# Patient Record
Sex: Male | Born: 1945 | Race: White | Hispanic: No | Marital: Married | Smoking: Former smoker
Health system: Southern US, Community
[De-identification: ages and names within clinical notes are randomized; demographics above are authoritative.]

## PROBLEM LIST (undated history)

## (undated) DIAGNOSIS — K2270 Barrett's esophagus without dysplasia: Secondary | ICD-10-CM

## (undated) DIAGNOSIS — I441 Atrioventricular block, second degree: Secondary | ICD-10-CM

## (undated) DIAGNOSIS — I499 Cardiac arrhythmia, unspecified: Secondary | ICD-10-CM

## (undated) DIAGNOSIS — C8590 Non-Hodgkin lymphoma, unspecified, unspecified site: Secondary | ICD-10-CM

## (undated) HISTORY — PX: ESOPHAGOGASTRODUODENOSCOPY: SHX1529

## (undated) HISTORY — DX: Non-Hodgkin lymphoma, unspecified, unspecified site: C85.90

## (undated) HISTORY — DX: Barrett's esophagus without dysplasia: K22.70

## (undated) HISTORY — PX: OTHER SURGICAL HISTORY: SHX169

---

## 1983-09-07 DIAGNOSIS — C8590 Non-Hodgkin lymphoma, unspecified, unspecified site: Secondary | ICD-10-CM

## 1983-09-07 HISTORY — DX: Non-Hodgkin lymphoma, unspecified, unspecified site: C85.90

## 1996-09-06 HISTORY — PX: HEMORRHOID BANDING: SHX5850

## 2003-09-07 HISTORY — PX: CHOLECYSTECTOMY: SHX55

## 2007-09-07 HISTORY — PX: COLON SURGERY: SHX602

## 2009-07-30 ENCOUNTER — Ambulatory Visit: Payer: Self-pay | Admitting: Unknown Physician Specialty

## 2010-08-19 ENCOUNTER — Ambulatory Visit: Payer: Self-pay | Admitting: Internal Medicine

## 2010-08-25 ENCOUNTER — Ambulatory Visit: Payer: Self-pay | Admitting: Internal Medicine

## 2010-09-06 ENCOUNTER — Ambulatory Visit: Payer: Self-pay | Admitting: Internal Medicine

## 2011-09-09 ENCOUNTER — Ambulatory Visit: Payer: Self-pay | Admitting: Unknown Physician Specialty

## 2011-09-10 LAB — PATHOLOGY REPORT

## 2012-03-08 IMAGING — CT NM PET TUM IMG RESTAG (PS) SKULL BASE T - THIGH
1 of 5 series · 2 of 25 positions shown · non-contrast
Comparison: none

REASON FOR EXAM: restaging lymphoma hx of new right SCL lymph node
COMMENTS:

[Series 3: ct wb 3.0 b30f · axial · 3.0mm · 0.98mm/px · z∈[-634,-536]mm · 2 of 435 slices shown]
[im 386/435  brain]
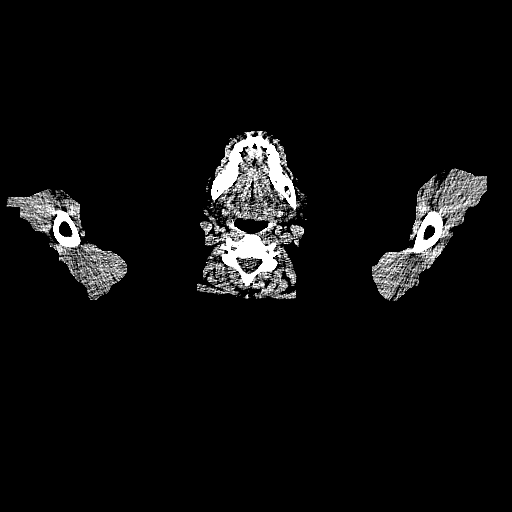
[im 435/435  brain]
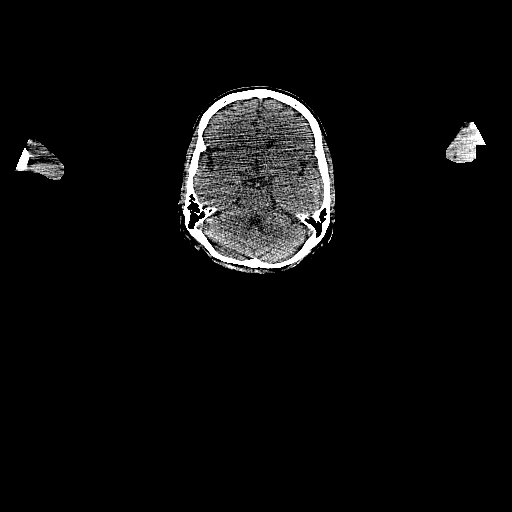

[2 of 25 positions shown; findings below may reference images not displayed]

PROCEDURE:     PET - PET/CT RESTAGING LYMPHOMA  - August 25, 2010  [DATE]

RESULT:     The patient's fasting blood glucose level is measured at 106
mg/dL. The patient received an injection of 12.34 mCi of fluorine 18 labeled
fluorodeoxyglucose in the left hand at [DATE] a.m. Imaging is performed from
the base of the brain to the thighs between the hours of [DATE] and [DATE] a.m.
Noncontrast CT is performed over the same regions for the purposes of
attenuation correction and fusion. The noncontrast CT data set, attenuation
corrected PET data set and fused PET/CT data sets are reconstructed in the
axial, coronal and sagittal planes utilizing the [employer] software. A
rotating three-dimensional MIP of the attenuation corrected PET data is also
utilized. There is no previous study available for comparison.

There is no evidence of abnormal localization to suggest F-18 FDG avid
malignancy. No adenopathy is evident on the CT. Minimal atherosclerotic
calcification is present in the aorta. The urinary bladder is nondistended.
The other abdominal and pulmonary structures appear to be unremarkable.
There does appear to be some atelectasis in the lower lobes.
IMPRESSION: No evidence of F-18 FDG avid malignancy. Normal tracer distribution present.
No definite adenopathy demonstrated.

## 2014-09-06 HISTORY — PX: COLONOSCOPY: SHX174

## 2014-09-12 ENCOUNTER — Ambulatory Visit: Payer: Self-pay | Admitting: Unknown Physician Specialty

## 2014-12-30 LAB — SURGICAL PATHOLOGY

## 2016-01-20 ENCOUNTER — Encounter: Payer: Self-pay | Admitting: *Deleted

## 2016-01-29 ENCOUNTER — Ambulatory Visit (INDEPENDENT_AMBULATORY_CARE_PROVIDER_SITE_OTHER): Payer: Medicare Other | Admitting: General Surgery

## 2016-01-29 ENCOUNTER — Encounter: Payer: Self-pay | Admitting: General Surgery

## 2016-01-29 VITALS — BP 134/70 | HR 50 | Resp 14 | Ht 68.0 in | Wt 157.0 lb

## 2016-01-29 DIAGNOSIS — K648 Other hemorrhoids: Secondary | ICD-10-CM

## 2016-01-29 DIAGNOSIS — K644 Residual hemorrhoidal skin tags: Secondary | ICD-10-CM

## 2016-01-29 NOTE — Patient Instructions (Signed)

## 2016-01-29 NOTE — Progress Notes (Signed)
Patient ID: Arthur Bauer, male   DOB: 1945-09-14, 70 y.o.   MRN: [id number]  Chief Complaint  Patient presents with  . Rectal Problems    hemorrhoid    HPI Arthur Bauer is a 71 y.o. male.  Here today for evaluation of hemorrhoids. He states 3 weeks ago he had rectal swelling, protursion and and pain. He was able to use hydrocortisone cream and supp to help reduce the swelling. The last time his hemorrhoid flared up was about 2 years ago. I have reviewed the history of present illness with the patient.    HPI  Past Medical History  Diagnosis Date  . 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 Barrett esophagus     Past Surgical History  Procedure Laterality Date  . Hemorrhoid banding  1998  . Cholecystectomy  2005  . Colon surgery  2009    Benign  . Colonoscopy  2016    Dr. Vira Agar    Family History  Problem Relation Age of Onset  . Cancer Father     pancreatic  . Alzheimer's disease Mother     Social History Social History  Substance Use Topics  . Smoking status: Former Smoker    Quit date: 09/07/1983  . Smokeless tobacco: Never Used  . Alcohol Use: No    Allergies  Allergen Reactions  . Sulfa Antibiotics Other (See Comments)    As a child    Current Outpatient Prescriptions  Medication Sig Dispense Refill  . atorvastatin (LIPITOR) 10 MG tablet TAKE 1 TABLET BY MOUTH EVERY DAY **NEED APPT FOR MORE REFILLS**  0  . Black Currant Seed Oil 500 MG CAPS Take by mouth.    . hydrocortisone (ANUSOL-HC) 25 MG suppository REMOVE FOIL AND INSERT 1 SUPPSITORY PER RECTUM FOR HEMORROIDS 3 TIMES A DAY  5  . LUTEIN PO Take by mouth.    . magnesium 30 MG tablet Take 30 mg by mouth daily.    . Multiple Vitamin (MULTIVITAMIN) capsule Take 1 capsule by mouth daily.    . NON FORMULARY argenine and beta sitosterol    . Omega-3 Fatty Acids (FISH OIL) 1200 MG CAPS Take by mouth.    Marland Kitchen omeprazole (PRILOSEC) 20 MG capsule TAKE ONE CAPSULE BY MOUTH EVERY DAY AS DIRECTED  1   No current  facility-administered medications for this visit.    Review of Systems Review of Systems  Constitutional: Negative.   Respiratory: Negative.   Cardiovascular: Negative.   Gastrointestinal: Positive for rectal pain. Negative for anal bleeding.    Blood pressure 134/70, pulse 50, resp. rate 14, height 5\' 8"  (1.727 m), weight 157 lb (71.215 kg).  Physical Exam Physical Exam  Constitutional: He is oriented to person, place, and time. He appears well-developed and well-nourished.  HENT:  Mouth/Throat: Oropharynx is clear and moist.  Eyes: Conjunctivae are normal. No scleral icterus.  Neck: Neck supple.  Cardiovascular: Normal rate, regular rhythm and normal heart sounds.   Pulmonary/Chest: Effort normal.  Abdominal: Soft.  Genitourinary: Rectal exam shows external hemorrhoid.  3 o'clock 1.5 cm external hemorrhoid, not inflamed or tender at present  Lymphadenopathy:    He has no cervical adenopathy.  Neurological: He is alert and oriented to person, place, and time.  Skin: Skin is warm and dry.  Psychiatric: His behavior is normal.    Data Reviewed Progress notes.  Assessment    External hemorrhoid. This is not a frequent problem and excision not indicated at present.      Plan  Pt advised fully.   Sample analpram cream given to use prn. Call for an appointment if area flares up again      PCP Dr Lovie Macadamia Ref Charlestine Massed PA   This information has been scribed by Karie Fetch RN, BSN,BC.   SANKAR,SEEPLAPUTHUR G 02/01/2016, 10:46 AM

## 2016-02-01 ENCOUNTER — Encounter: Payer: Self-pay | Admitting: General Surgery

## 2019-05-17 ENCOUNTER — Other Ambulatory Visit: Payer: Self-pay

## 2019-05-17 ENCOUNTER — Other Ambulatory Visit
Admission: RE | Admit: 2019-05-17 | Discharge: 2019-05-17 | Disposition: A | Discharge status: Home / Self Care | Payer: Medicare Other | Source: Ambulatory Visit | Attending: Internal Medicine | Admitting: Internal Medicine

## 2019-05-17 DIAGNOSIS — Z20828 Contact with and (suspected) exposure to other viral communicable diseases: Secondary | ICD-10-CM | POA: Diagnosis present

## 2019-05-17 DIAGNOSIS — Z01812 Encounter for preprocedural laboratory examination: Secondary | ICD-10-CM | POA: Diagnosis not present

## 2019-05-17 LAB — SARS CORONAVIRUS 2 (TAT 6-24 HRS): SARS Coronavirus 2: NEGATIVE

## 2019-05-21 ENCOUNTER — Encounter: Payer: Self-pay | Admitting: *Deleted

## 2019-05-22 ENCOUNTER — Ambulatory Visit: Payer: Medicare Other | Admitting: Certified Registered"

## 2019-05-22 ENCOUNTER — Ambulatory Visit
Admission: RE | Admit: 2019-05-22 | Discharge: 2019-05-22 | Disposition: A | Discharge status: Home / Self Care | Payer: Medicare Other | Attending: Internal Medicine | Admitting: Internal Medicine

## 2019-05-22 ENCOUNTER — Encounter
Admission: RE | Disposition: A | Discharge status: Home / Self Care | Payer: Self-pay | Source: Home / Self Care | Attending: Internal Medicine

## 2019-05-22 ENCOUNTER — Other Ambulatory Visit: Payer: Self-pay

## 2019-05-22 DIAGNOSIS — Z79899 Other long term (current) drug therapy: Secondary | ICD-10-CM | POA: Diagnosis not present

## 2019-05-22 DIAGNOSIS — K2270 Barrett's esophagus without dysplasia: Secondary | ICD-10-CM | POA: Insufficient documentation

## 2019-05-22 DIAGNOSIS — Z8572 Personal history of non-Hodgkin lymphomas: Secondary | ICD-10-CM | POA: Diagnosis not present

## 2019-05-22 DIAGNOSIS — Z87891 Personal history of nicotine dependence: Secondary | ICD-10-CM | POA: Insufficient documentation

## 2019-05-22 DIAGNOSIS — Z13810 Encounter for screening for upper gastrointestinal disorder: Secondary | ICD-10-CM | POA: Diagnosis present

## 2019-05-22 HISTORY — PX: ESOPHAGOGASTRODUODENOSCOPY (EGD) WITH PROPOFOL: SHX5813

## 2019-05-22 SURGERY — ESOPHAGOGASTRODUODENOSCOPY (EGD) WITH PROPOFOL
Anesthesia: General

## 2019-05-22 MED ORDER — PROPOFOL 10 MG/ML IV BOLUS
INTRAVENOUS | Status: DC | PRN
  Administered 2019-05-22: 60 mg via INTRAVENOUS

## 2019-05-22 MED ORDER — PROPOFOL 500 MG/50ML IV EMUL
INTRAVENOUS | Status: AC
  Filled 2019-05-22: qty 50

## 2019-05-22 MED ORDER — ATROPINE SULFATE 0.4 MG/ML IJ SOLN
INTRAMUSCULAR | Status: DC | PRN
  Administered 2019-05-22: 0.4 mg via INTRAVENOUS

## 2019-05-22 MED ORDER — SODIUM CHLORIDE 0.9 % IV SOLN
INTRAVENOUS | Status: DC
  Administered 2019-05-22: 12:00:00 1000 mL via INTRAVENOUS

## 2019-05-22 MED ORDER — PROPOFOL 500 MG/50ML IV EMUL
INTRAVENOUS | Status: DC | PRN
  Administered 2019-05-22: 145 ug/kg/min via INTRAVENOUS

## 2019-05-22 NOTE — Op Note (Signed)
[facility] Patient Name: Arthur Bauer Procedure Date: 05/22/2019 11:05 AM MRN: [id number] Account #: [id number] Date of Birth: [birth year] Admit Type: Outpatient Age: 73 Room: [facility] ENDO ROOM 2 Gender: Male Note Status: Finalized Procedure:            Upper GI endoscopy Indications:          Surveillance for malignancy due to personal history of                        Barrett's esophagus Providers:            Benay Pike. Alice Reichert MD, MD Referring MD:         Youlanda Roys. Lovie Macadamia, MD (Referring MD) Medicines:            Propofol per Anesthesia Complications:        No immediate complications. Procedure:            Pre-Anesthesia Assessment:                       - The risks and benefits of the procedure and the                        sedation options and risks were discussed with the                        patient. All questions were answered and informed                        consent was obtained.                       - Patient identification and proposed procedure were                        verified prior to the procedure by the nurse. The                        procedure was verified in the procedure room.                       - ASA Grade Assessment: III - A patient with severe                        systemic disease.                       - After reviewing the risks and benefits, the patient                        was deemed in satisfactory condition to undergo the                        procedure.                       After obtaining informed consent, the endoscope was                        passed under direct vision. Throughout the procedure,  the patient's blood pressure, pulse, and oxygen                        saturations were monitored continuously. The Endoscope                        was introduced through the mouth, and advanced to the                        third part of duodenum. The upper GI endoscopy was                   accomplished without difficulty. The patient tolerated                        the procedure well. Findings:      There were esophageal mucosal changes secondary to established       short-segment Barrett's disease present at the gastroesophageal       junction. The maximum longitudinal extent of these mucosal changes was 1       cm in length. Mucosa was biopsied with a cold forceps for histology in 4       quadrants at the gastroesophageal junction. One specimen bottle was sent       to pathology.      The stomach was normal.      The examined duodenum was normal. Impression:           - Esophageal mucosal changes secondary to established                        short-segment Barrett's disease. Biopsied.                       - Normal stomach.                       - Normal examined duodenum. Recommendation:       - Patient has a contact number available for                        emergencies. The signs and symptoms of potential                        delayed complications were discussed with the patient.                        Return to normal activities tomorrow. Written discharge                        instructions were provided to the patient.                       - Resume previous diet.                       - Continue present medications.                       - Await pathology results.                       - Refer to a cardiologist at appointment to be  scheduled.                       - Hemodynamically stable cardiac arrhythmia noted                        during today's procedure (bradycardia with possible                        heart block).                       - Return to nurse practitioner as previously scheduled. Procedure Code(s):    --- Professional ---                       [phone], Esophagogastroduodenoscopy, flexible, transoral;                        with biopsy, single or multiple Diagnosis Code(s):    --- Professional ---                        K22.70, Barrett's esophagus without dysplasia CPT copyright 2019 American Medical Association. All rights reserved. The codes documented in this report are preliminary and upon coder review may  be revised to meet current compliance requirements. Efrain Sella MD, MD 05/22/2019 12:11:31 PM This report has been signed electronically. Number of Addenda: 0 Note Initiated On: 05/22/2019 11:05 AM Estimated Blood Loss: Estimated blood loss: none.      [facility]

## 2019-05-22 NOTE — Transfer of Care (Signed)
Immediate Anesthesia Transfer of Care Note  Patient: Arthur Bauer  Procedure(s) Performed: ESOPHAGOGASTRODUODENOSCOPY (EGD) WITH PROPOFOL (N/A )  Patient Location: PACU  Anesthesia Type:General  Level of Consciousness: drowsy  Airway & Oxygen Therapy: Patient Spontanous Breathing and Patient connected to nasal cannula oxygen  Post-op Assessment: Report given to RN and Post -op Vital signs reviewed and stable  Post vital signs: Reviewed and stable  Last Vitals:  Vitals Value Taken Time  BP 88/49 05/22/19 1214  Temp 36.7 C 05/22/19 1212  Pulse 53 05/22/19 1215  Resp 19 05/22/19 1215  SpO2 100 % 05/22/19 1215  Vitals shown include unvalidated device data.  Last Pain:  Vitals:   05/22/19 1212  TempSrc: Tympanic  PainSc: Asleep         Complications: No apparent anesthesia complications

## 2019-05-22 NOTE — OR Nursing (Signed)
12 lead EKG obtained per Dr. Andree Elk.  It was read and pt was released/  Will require cardiac consult before next procedure.

## 2019-05-22 NOTE — Anesthesia Post-op Follow-up Note (Signed)
Anesthesia QCDR form completed.        

## 2019-05-22 NOTE — H&P (Signed)
Outpatient short stay form Pre-procedure 05/22/2019 10:30 AM Edgerrin Correia K. Alice Reichert, M.D.  Primary Physician: Juluis Pitch, M.D.  Reason for visit:  Barrett's esophagus  History of present illness:  73 y/o male has personal hx of Barrett's esophagus. Denies dysphagia, weight loss or abdominal pain.   No current facility-administered medications for this encounter.   Current Outpatient Medications:  .  atorvastatin (LIPITOR) 10 MG tablet, TAKE 1 TABLET BY MOUTH EVERY DAY **NEED APPT FOR MORE REFILLS**, Disp: , Rfl: 0 .  Black Currant Seed Oil 500 MG CAPS, Take by mouth., Disp: , Rfl:  .  hydrocortisone (ANUSOL-HC) 25 MG suppository, REMOVE FOIL AND INSERT 1 SUPPSITORY PER RECTUM FOR HEMORROIDS 3 TIMES A DAY, Disp: , Rfl: 5 .  LUTEIN PO, Take by mouth., Disp: , Rfl:  .  magnesium 30 MG tablet, Take 30 mg by mouth daily., Disp: , Rfl:  .  Multiple Vitamin (MULTIVITAMIN) capsule, Take 1 capsule by mouth daily., Disp: , Rfl:  .  NON FORMULARY, argenine and beta sitosterol, Disp: , Rfl:  .  Omega-3 Fatty Acids (FISH OIL) 1200 MG CAPS, Take by mouth., Disp: , Rfl:  .  omeprazole (PRILOSEC) 20 MG capsule, TAKE ONE CAPSULE BY MOUTH EVERY DAY AS DIRECTED, Disp: , Rfl: 1  No medications prior to admission.     Allergies  Allergen Reactions  . Sulfa Antibiotics Other (See Comments)    As a child     Past Medical History:  Diagnosis Date  . Barrett esophagus   . Non-Hodgkin lymphoma ([place]) 1985    Review of systems:  Otherwise negative.    Physical Exam  Gen: Alert, oriented. Appears stated age.  HEENT: [place]/AT. PERRLA. Lungs: CTA, no wheezes. CV: RR nl S1, S2. Abd: soft, benign, no masses. BS+ Ext: No edema. Pulses 2+    Planned procedures: Proceed with EGD with surveillance biopsies. The patient understands the nature of the planned procedure, indications, risks, alternatives and potential complications including but not limited to bleeding, infection, perforation, damage to  internal organs and possible oversedation/side effects from anesthesia. The patient agrees and gives consent to proceed.  Please refer to procedure notes for findings, recommendations and patient disposition/instructions.     Casin Federici K. Alice Reichert, M.D. Gastroenterology 05/22/2019  10:30 AM

## 2019-05-22 NOTE — Anesthesia Preprocedure Evaluation (Signed)
Anesthesia Evaluation  Patient identified by MRN, date of birth, ID band Patient awake    Reviewed: Allergy & Precautions, H&P , NPO status , Patient's Chart, lab work & pertinent test results, reviewed documented beta blocker date and time   Airway Mallampati: II   Neck ROM: full    Dental  (+) Poor Dentition   Pulmonary neg pulmonary ROS, former smoker,    Pulmonary exam normal        Cardiovascular Exercise Tolerance: Good negative cardio ROS Normal cardiovascular exam+ dysrhythmias  Rhythm:regular Rate:Normal     Neuro/Psych negative neurological ROS  negative psych ROS   GI/Hepatic negative GI ROS, Neg liver ROS,   Endo/Other  negative endocrine ROS  Renal/GU negative Renal ROS  negative genitourinary   Musculoskeletal   Abdominal   Peds  Hematology negative hematology ROS (+)   Anesthesia Other Findings Past Medical History: No date: Barrett esophagus 1985: Non-Hodgkin lymphoma ([place]) Past Surgical History: 2005: CHOLECYSTECTOMY 2009: COLON SURGERY     Comment:  Benign 2016: COLONOSCOPY     Comment:  Dr. Vira Agar No date: ESOPHAGOGASTRODUODENOSCOPY 1998: HEMORRHOID BANDING No date: mediastinal surgery for non-Hodgkins lymphoma BMI    Body Mass Index: 22.96 kg/m     Reproductive/Obstetrics negative OB ROS                             Anesthesia Physical Anesthesia Plan  ASA: II  Anesthesia Plan: General   Post-op Pain Management:    Induction:   PONV Risk Score and Plan:   Airway Management Planned:   Additional Equipment:   Intra-op Plan:   Post-operative Plan:   Informed Consent: I have reviewed the patients History and Physical, chart, labs and discussed the procedure including the risks, benefits and alternatives for the proposed anesthesia with the patient or authorized representative who has indicated his/her understanding and acceptance.     Dental  Advisory Given  Plan Discussed with: CRNA  Anesthesia Plan Comments:         Anesthesia Quick Evaluation

## 2019-05-23 ENCOUNTER — Encounter: Payer: Self-pay | Admitting: Internal Medicine

## 2019-05-23 LAB — SURGICAL PATHOLOGY

## 2019-05-29 NOTE — Anesthesia Postprocedure Evaluation (Signed)
Anesthesia Post Note  Patient: Arthur Bauer  Procedure(s) Performed: ESOPHAGOGASTRODUODENOSCOPY (EGD) WITH PROPOFOL (N/A )  Patient location during evaluation: PACU Anesthesia Type: General Level of consciousness: awake and alert Pain management: pain level controlled Vital Signs Assessment: post-procedure vital signs reviewed and stable Respiratory status: spontaneous breathing, nonlabored ventilation, respiratory function stable and patient connected to nasal cannula oxygen Cardiovascular status: blood pressure returned to baseline and stable Postop Assessment: no apparent nausea or vomiting Anesthetic complications: no     Last Vitals:  Vitals:   05/22/19 1232 05/22/19 1242  BP: (!) 129/59 (!) 151/130  Pulse: (!) 55 94  Resp: 14 13  Temp:    SpO2: 99% 98%    Last Pain:  Vitals:   05/22/19 1242  TempSrc:   PainSc: 0-No pain                 Molli Barrows

## 2019-08-30 ENCOUNTER — Ambulatory Visit: Payer: Medicare Other | Attending: Internal Medicine

## 2019-08-30 DIAGNOSIS — Z20822 Contact with and (suspected) exposure to covid-19: Secondary | ICD-10-CM

## 2019-09-01 LAB — NOVEL CORONAVIRUS, NAA: SARS-CoV-2, NAA: DETECTED — AB

## 2019-09-02 ENCOUNTER — Telehealth: Payer: Self-pay | Admitting: Infectious Diseases

## 2019-09-02 NOTE — Telephone Encounter (Signed)
MyChart sent to discuss with patient about Covid symptoms and the use of bamlanivimab, a monoclonal antibody infusion for those with mild to moderate Covid symptoms and at a high risk of hospitalization.  Pt is qualified for this infusion at the [facility] infusion center due to Age > 63.    Message left to call back to discuss symptom onset to determine criteria.

## 2019-10-22 ENCOUNTER — Ambulatory Visit: Payer: Medicare Other | Attending: Internal Medicine

## 2019-10-22 ENCOUNTER — Other Ambulatory Visit: Payer: Self-pay

## 2019-10-22 DIAGNOSIS — Z23 Encounter for immunization: Secondary | ICD-10-CM

## 2019-10-22 NOTE — Progress Notes (Signed)
   Covid-19 Vaccination Clinic  Name:  MAZI RAUSCH    MRN: [id number] DOB: May 22, 1946  10/22/2019  Mr. Corrales was observed post Covid-19 immunization for 15 minutes without incidence. He was provided with Vaccine Information Sheet and instruction to access the V-Safe system.   Mr. Payment was instructed to call 911 with any severe reactions post vaccine: Marland Kitchen Difficulty breathing  . Swelling of your face and throat  . A fast heartbeat  . A bad rash all over your body  . Dizziness and weakness    Immunizations Administered    Name Date Dose VIS Date Route   Pfizer COVID-19 Vaccine 10/22/2019 11:18 AM 0.3 mL 08/17/2019 Intramuscular   Manufacturer: [place]   Lot: [id number]   [place]: [id number]

## 2019-11-20 ENCOUNTER — Ambulatory Visit: Payer: Medicare Other | Attending: Internal Medicine

## 2019-11-20 DIAGNOSIS — Z23 Encounter for immunization: Secondary | ICD-10-CM

## 2019-11-20 NOTE — Progress Notes (Signed)
   Covid-19 Vaccination Clinic  Name:  JACIN DUKES    MRN: [id number] DOB: [birth year]  11/20/2019  Mr. Goertz was observed post Covid-19 immunization for 15 minutes without incident. He was provided with Vaccine Information Sheet and instruction to access the V-Safe system.   Mr. Welker was instructed to call 911 with any severe reactions post vaccine: Marland Kitchen Difficulty breathing  . Swelling of face and throat  . A fast heartbeat  . A bad rash all over body  . Dizziness and weakness   Immunizations Administered    Name Date Dose VIS Date Route   Pfizer COVID-19 Vaccine 11/20/2019 12:20 PM 0.3 mL 08/17/2019 Intramuscular   Manufacturer: [place]   Lot: [id number]   [place]: [id number]

## 2022-04-14 ENCOUNTER — Encounter: Payer: Self-pay | Admitting: Internal Medicine

## 2022-04-14 ENCOUNTER — Encounter
Admission: RE | Disposition: A | Discharge status: Home / Self Care | Payer: Self-pay | Source: Ambulatory Visit | Attending: Internal Medicine

## 2022-04-14 ENCOUNTER — Ambulatory Visit: Admit: 2022-04-14 | Payer: 59 | Admitting: Internal Medicine

## 2022-04-14 ENCOUNTER — Ambulatory Visit: Payer: Medicare Other | Admitting: Anesthesiology

## 2022-04-14 ENCOUNTER — Ambulatory Visit
Admission: RE | Admit: 2022-04-14 | Discharge: 2022-04-14 | Disposition: A | Discharge status: Home / Self Care | Payer: Medicare Other | Source: Ambulatory Visit | Attending: Internal Medicine | Admitting: Internal Medicine

## 2022-04-14 DIAGNOSIS — D123 Benign neoplasm of transverse colon: Secondary | ICD-10-CM | POA: Insufficient documentation

## 2022-04-14 DIAGNOSIS — Z79899 Other long term (current) drug therapy: Secondary | ICD-10-CM | POA: Insufficient documentation

## 2022-04-14 DIAGNOSIS — K2270 Barrett's esophagus without dysplasia: Secondary | ICD-10-CM | POA: Insufficient documentation

## 2022-04-14 DIAGNOSIS — K317 Polyp of stomach and duodenum: Secondary | ICD-10-CM | POA: Diagnosis not present

## 2022-04-14 DIAGNOSIS — Z8572 Personal history of non-Hodgkin lymphomas: Secondary | ICD-10-CM | POA: Diagnosis not present

## 2022-04-14 DIAGNOSIS — K3189 Other diseases of stomach and duodenum: Secondary | ICD-10-CM | POA: Diagnosis not present

## 2022-04-14 DIAGNOSIS — K5730 Diverticulosis of large intestine without perforation or abscess without bleeding: Secondary | ICD-10-CM | POA: Insufficient documentation

## 2022-04-14 DIAGNOSIS — Z1211 Encounter for screening for malignant neoplasm of colon: Secondary | ICD-10-CM | POA: Diagnosis not present

## 2022-04-14 DIAGNOSIS — K640 First degree hemorrhoids: Secondary | ICD-10-CM | POA: Diagnosis not present

## 2022-04-14 DIAGNOSIS — R001 Bradycardia, unspecified: Secondary | ICD-10-CM | POA: Insufficient documentation

## 2022-04-14 DIAGNOSIS — I441 Atrioventricular block, second degree: Secondary | ICD-10-CM | POA: Diagnosis not present

## 2022-04-14 HISTORY — PX: COLONOSCOPY: SHX5424

## 2022-04-14 HISTORY — PX: ESOPHAGOGASTRODUODENOSCOPY: SHX5428

## 2022-04-14 HISTORY — DX: Atrioventricular block, second degree: I44.1

## 2022-04-14 HISTORY — DX: Cardiac arrhythmia, unspecified: I49.9

## 2022-04-14 SURGERY — COLONOSCOPY
Anesthesia: General

## 2022-04-14 MED ORDER — GLYCOPYRROLATE 0.2 MG/ML IJ SOLN
INTRAMUSCULAR | Status: AC
  Filled 2022-04-14: qty 1

## 2022-04-14 MED ORDER — LIDOCAINE HCL (CARDIAC) PF 100 MG/5ML IV SOSY
PREFILLED_SYRINGE | INTRAVENOUS | Status: DC | PRN
  Administered 2022-04-14: 50 mg via INTRAVENOUS

## 2022-04-14 MED ORDER — PROPOFOL 10 MG/ML IV BOLUS
INTRAVENOUS | Status: DC | PRN
  Administered 2022-04-14: 20 mg via INTRAVENOUS
  Administered 2022-04-14: 60 mg via INTRAVENOUS

## 2022-04-14 MED ORDER — PROPOFOL 500 MG/50ML IV EMUL
INTRAVENOUS | Status: DC | PRN
  Administered 2022-04-14: 150 ug/kg/min via INTRAVENOUS

## 2022-04-14 MED ORDER — SODIUM CHLORIDE 0.9 % IV SOLN: INTRAVENOUS | Status: DC

## 2022-04-14 MED ORDER — ATROPINE SULFATE 0.4 MG/ML IV SOLN
INTRAVENOUS | Status: AC
  Filled 2022-04-14: qty 1

## 2022-04-14 MED ORDER — EPHEDRINE SULFATE (PRESSORS) 50 MG/ML IJ SOLN
INTRAMUSCULAR | Status: DC | PRN
  Administered 2022-04-14: 5 mg via INTRAVENOUS

## 2022-04-14 MED ORDER — ATROPINE SULFATE 0.4 MG/ML IV SOLN
INTRAVENOUS | Status: DC | PRN
  Administered 2022-04-14: .4 mg via INTRAVENOUS

## 2022-04-14 NOTE — Transfer of Care (Signed)
Immediate Anesthesia Transfer of Care Note  Patient: Arthur Bauer  Procedure(s) Performed: COLONOSCOPY ESOPHAGOGASTRODUODENOSCOPY (EGD)  Patient Location: Endoscopy Unit  Anesthesia Type:General  Level of Consciousness: drowsy  Airway & Oxygen Therapy: Patient Spontanous Breathing  Post-op Assessment: Report given to RN and Post -op Vital signs reviewed and stable  Post vital signs: Reviewed and stable  Last Vitals:  Vitals Value Taken Time  BP 90/75 04/14/22 1412  Temp    Pulse 80 04/14/22 1412  Resp 20 04/14/22 1412  SpO2 100 % 04/14/22 1412  Vitals shown include unvalidated device data.  Last Pain:  Vitals:   04/14/22 1318  TempSrc: Temporal         Complications: No notable events documented.

## 2022-04-14 NOTE — Anesthesia Preprocedure Evaluation (Signed)
Anesthesia Evaluation  Patient identified by MRN, date of birth, ID band Patient awake    Reviewed: Allergy & Precautions, NPO status , Patient's Chart, lab work & pertinent test results  History of Anesthesia Complications Negative for: history of anesthetic complications  Airway Mallampati: III  TM Distance: <3 FB Neck ROM: full    Dental  (+) Chipped   Pulmonary neg shortness of breath, former smoker,    Pulmonary exam normal        Cardiovascular Exercise Tolerance: Good (-) angina(-) DOE Normal cardiovascular exam+ dysrhythmias      Neuro/Psych negative neurological ROS  negative psych ROS   GI/Hepatic negative GI ROS, Neg liver ROS, neg GERD  ,  Endo/Other  negative endocrine ROS  Renal/GU negative Renal ROS  negative genitourinary   Musculoskeletal   Abdominal   Peds  Hematology negative hematology ROS (+)   Anesthesia Other Findings Past Medical History: No date: Barrett esophagus No date: Dysrhythmia 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 Vira Agar No date: ESOPHAGOGASTRODUODENOSCOPY 05/22/2019: ESOPHAGOGASTRODUODENOSCOPY (EGD) WITH PROPOFOL; N/A     Comment:  Procedure: ESOPHAGOGASTRODUODENOSCOPY (EGD) WITH               PROPOFOL;  Surgeon: [place], Benay Pike, MD;  Location:               [facility] ENDOSCOPY;  Service: Gastroenterology;  Laterality:               N/A; 1998: HEMORRHOID BANDING No date: mediastinal surgery for non-Hodgkins lymphoma  BMI    Body Mass Index: 22.96 kg/m      Reproductive/Obstetrics negative OB ROS                             Anesthesia Physical Anesthesia Plan  ASA: 3  Anesthesia Plan: General   Post-op Pain Management:    Induction:  Intravenous  PONV Risk Score and Plan: Propofol infusion and TIVA  Airway Management Planned: Natural Airway and Nasal Cannula  Additional Equipment:   Intra-op Plan:   Post-operative Plan:   Informed Consent: I have reviewed the patients History and Physical, chart, labs and discussed the procedure including the risks, benefits and alternatives for the proposed anesthesia with the patient or authorized representative who has indicated his/her understanding and acceptance.     Dental Advisory Given  Plan Discussed with: Anesthesiologist, CRNA and Surgeon  Anesthesia Plan Comments: (Patient consented for risks of anesthesia including but not limited to:  - adverse reactions to medications - risk of airway placement if required - damage to eyes, teeth, lips or other oral mucosa - nerve damage due to positioning  - sore throat or hoarseness - Damage to heart, brain, nerves, lungs, other parts of body or loss of life  Patient voiced understanding.)        Anesthesia Quick Evaluation

## 2022-04-14 NOTE — H&P (Signed)
Outpatient short stay form Pre-procedure 04/14/2022 12:58 PM Arthur Bauer K. Alice Reichert, M.D.  Primary Physician: Juluis Pitch, M.D.  Reason for visit:  Barrett's esophagus, personal history of adenomatous colon polyps.  History of present illness:    Arthur Bauer is a 76 year old male with history of B-cell lymphoma, Barrett's esophagus, gastritis, total history of colon polyps, second-degree AV heart block Mobitz 1, low normal left ventricular systolic function with estimated EF 45%, mild valvular regurgitation. No valvular stenosis, hypercholesterolemia.   He reports no lower GI complaints. No weight loss, anorexia, abdominal pain, change in bowel habits, or hematochezia.   He has no upper GI complaints. No chest pain, dysphagia, reflux, regurgitation, epigastric pain, nausea/vomiting, or melena.   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 He reports there has been no recommendation for pacemaker as he remains asymptomatic.He walks 7.5 miles daily every morning and it takes him 90 min. Recent trip to [place] and [place] in March and did great. He is here for follow-up colonoscopy for personal history of colon polyps. He has had no chest pain, pressure, heaviness, tightness, dizziness, lightheadedness, DOE.   Last colonoscopy: 09/09/2016 for personal history of colon polyps revealed 2 polyps diverticulosis and internal hemorrhoids. Repeat 09/2021   Last EGD: 09/12/2014 for follow-up Barrett's esophagus repeat-year-old esophageal mucosal changes secondary to short segment Barrett's disease, erythematous mucosa in the antrum, normal examined duodenum. Repeat 2019  -05/22/2019 EGD dictation surveillance for malignancy due to personal history of Barrett's esophagus: Impression esophageal mucosal changes secondary to established short segment Barrett's disease. Normal stomach. Normal examined duodenum. 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  Negative for dysplasia or malignancy. Repeat EGD 2025.   GI Medications: Current: Omeprazole 20 mg daily  INTERVAL HISTORY   Arthur Bauer is a 76 year old male with history of B-cell lymphoma, Barrett's esophagus, gastritis, total history of colon polyps, second-degree AV heart block Mobitz 1, low normal left ventricular systolic function with estimated EF 45%, mild valvular regurgitation. No valvular stenosis, hypercholesterolemia.   He reports no lower GI complaints. No weight loss, anorexia, abdominal pain, change in bowel habits, or hematochezia.   He has no upper GI complaints. No chest pain, dysphagia, reflux, regurgitation, epigastric pain, nausea/vomiting, or melena.   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 He reports there has been no recommendation for pacemaker as he remains asymptomatic.He walks 7.5 miles daily every morning and it takes him 90 min. Recent trip to [place] and [place] in March and did great. He is here for follow-up colonoscopy for personal history of colon polyps. He has had no chest pain, pressure, heaviness, tightness, dizziness, lightheadedness, DOE.  GENERAL REVIEW OF SYSTEMS:  A ROS was performed including pertinent positive and negatives as documented. All other systems are negative.  MEDICATIONS: Outpatient Encounter Medications as of 01/05/2022  Medication Sig Dispense Refill   atorvastatin (LIPITOR) 10 MG tablet Take 1 tablet (10 mg total) by mouth once daily 90 tablet 1   cholecalciferol (VITAMIN D3) 1,000 unit capsule Take 1,000 Units by mouth once daily   L.acid/B.bifidum/B.animal/FOS (PROBIOTIC COMPLEX ORAL) Take 1 tablet by mouth once daily   MAGNESIUM CITRATE ORAL Take 1 tablet by mouth once daily   multivitamin tablet Take 1 tablet by mouth once daily.   omeprazole (PRILOSEC) 20 MG DR capsule TAKE 1 CAPSULE BY MOUTH EVERY DAY 90 capsule 2   PYGEUM AFRICANUM ORAL Take 1 tablet by mouth once daily   zinc sulfate (ZINC-15 ORAL)  Take 1 tablet by mouth once daily   sodium, potassium, and magnesium (SUPREP) oral solution Take 1 Bottle by mouth as directed One kit contains 2 bottles. Take both bottles at the times instructed by your provider. 354 mL 0   No facility-administered encounter medications on file as of 01/05/2022.   ALLERGIES: Sulfa (sulfonamide antibiotics)  PAST MEDICAL HISTORY: Past Medical History:  Diagnosis Date   Barrett's esophagus   COVID-19 2020   ED (erectile dysfunction)   Gastritis   History of B-cell lymphoma   History of hemorrhoids   PAST SURGICAL HISTORY: Past Surgical History:  Procedure Laterality Date   COLONOSCOPY 08/16/2008  Adenomatous Polyps   EGD 08/16/2008  Barrett's Esophagus   COLONOSCOPY 07/30/2009  [place] Adenomatous Polyps   COLONOSCOPY 09/09/2011  Adenomatous Polyp: CBF 09/2016; Recall Ltr mailed 07/13/2016 (dw)   EGD 09/09/2011  Barrett's Esophagus: CBF 09/2014; 1st recall ltr mailed 07/23/2014 (dw)   EGD 09/12/2014  Barrett's Esophagus: CBF 09/2017; Recall Ltr mailed 09/01/2017 (dw)   COLONOSCOPY 09/09/2016  Adenomatous Polyp: CBF 09/2021   EGD 05/22/2019  Barrett's Esophagus/Repeat 9yrs/TKT   CHOLECYSTECTOMY   Mediastinal surgery for non-Hodgkin's lymphoma    FAMILY HISTORY: Family History  Problem Relation Age of Onset   High blood pressure (Hypertension) Mother   Stroke Mother   High blood pressure (Hypertension) Father   Colon cancer Father   Pancreatic cancer Father   Coronary Artery Disease (Blocked arteries around heart) Paternal Grandmother   Coronary Artery Disease (Blocked arteries around heart) Paternal Grandfather    SOCIAL HISTORY: Social History   Socioeconomic History   Marital status: Married  Tobacco Use   Smoking status: Former   Smokeless tobacco: Never  Scientific laboratory technician Use: Never used  Substance and Sexual Activity   Alcohol use: No   Drug use: Never   Sexual activity: Defer  Social History Narrative  Shingrix:2018    Vitals:  01/05/22 1031  BP: 134/68  Pulse: (!) 44  Weight: 71.7 kg (158 lb)  Height: 171.5 cm (5' 7.5")   Wt Readings from Last 3 Encounters:  01/05/22 71.7 kg (158 lb)  12/21/21 71.4 kg (157 lb 8 oz)  11/23/21 72.6 kg (160 lb)   PHYSICAL EXAM: Pleasant, non-toxic appearing male on exam bench. Answers all questions appropriately and thoroughly.  General: NAD, alert and oriented x 4 HEENT: Anicteric, normocephalic without obvious abnormality Neck: supple, no JVD or thyromegaly. No lymphadenopathy.  Respiratory: CTA bilaterally, no wheezes, crackles, or other adventitious sounds Cardiac: RRR, no murmur, rub, or gallop  GI: soft, normal bowel sounds, no TTP, no HSM, no rebound or guarding\ Rectal: deferred Msk: no edema Skin: Skin color, texture, turgor normal, no rashes or lesions Lymph: no LAD Neuro: Grossly intact   REVIEW OF DATA: I have reviewed the following data today:  Labs: Ancillary Procedure on 12/10/2021  Component Date Value   LV Ejection Fraction (%) 12/10/2021 45   Aortic Valve Regurgitati* 12/10/2021 mild   Aortic Valve Stenosis Gr* 12/10/2021 none   Aortic Valve Max Velocit* 12/10/2021 1.6   Aortic Valve Stenosis Me* 12/10/2021 4.8   Mitral Valve Regurgitati* 12/10/2021 mild   Mitral Valve Stenosis Gr* 12/10/2021 none   Tricuspid Valve Regurgit* 12/10/2021 mild   Tricuspid Valve Regurgit* 12/10/2021 2.9   Right Ventricle Systolic* 16/06/9603 54.0   LV End Diastolic Diamete* 98/07/9146 5.4   LV End Systolic Diameter* 82/95/6213 3.9   LV Septum Wall Thickness* 12/10/2021 1.1   LV Posterior Wall Thickn* 12/10/2021  0.97   Left Atrium Diameter (cm) 12/10/2021 3.1  Initial consult on 11/23/2021  Component Date Value   Vent Rate (bpm) 11/23/2021 83   QRS Interval (msec) 11/23/2021 142   QT Interval (msec) 11/23/2021 282   QTc (msec) 11/23/2021 331  Office Visit on 11/04/2021  Component Date Value   Glucose 11/04/2021 84   Sodium 11/04/2021 140    Potassium 11/04/2021 4.4   Chloride 11/04/2021 105   Carbon Dioxide (CO2) 11/04/2021 25.1   Urea Nitrogen (BUN) 11/04/2021 21   Creatinine 11/04/2021 1.0   Glomerular Filtration Ra* 11/04/2021 73   Calcium 11/04/2021 9.9   AST 11/04/2021 23   ALT 11/04/2021 26   Alk Phos (alkaline Phosp* 11/04/2021 72   Albumin 11/04/2021 4.5   Bilirubin, Total 11/04/2021 1.1   Protein, Total 11/04/2021 7.2   A/G Ratio 11/04/2021 1.7   Cholesterol, Total 11/04/2021 149   Triglyceride 11/04/2021 136   HDL (High Density Lipopr* 11/04/2021 41.9   LDL Calculated 11/04/2021 80   VLDL Cholesterol 11/04/2021 27   Cholesterol/HDL Ratio 11/04/2021 3.6   PSA (Prostate Specific A* 11/04/2021 5.79 (H)   Magnesium 11/04/2021 2.2   Vitamin B12 11/04/2021 604   Color 11/04/2021 Yellow   Clarity 11/04/2021 Clear   Specific Gravity 11/04/2021 <=1.005   pH, Urine 11/04/2021 6.0   Protein, Urinalysis 11/04/2021 Negative   Glucose, Urinalysis 11/04/2021 Negative   Ketones, Urinalysis 11/04/2021 Negative   Blood, Urinalysis 11/04/2021 Negative   Nitrite, Urinalysis 11/04/2021 Negative   Leukocyte Esterase, Urin* 11/04/2021 Negative   White Blood Cells, Urina* 11/04/2021 0-3   Red Blood Cells, Urinaly* 11/04/2021 None Seen   Bacteria, Urinalysis 11/04/2021 None Seen   Squamous Epithelial Cell* 11/04/2021 None Seen   WBC (White Blood Cell Co* 11/04/2021 8.1   RBC (Red Blood Cell Coun* 11/04/2021 4.85   Hemoglobin 11/04/2021 15.9   Hematocrit 11/04/2021 47.8   MCV (Mean Corpuscular Vo* 11/04/2021 98.6   MCH (Mean Corpuscular He* 11/04/2021 32.8 (H)   MCHC (Mean Corpuscular H* 11/04/2021 33.3   Platelet Count 11/04/2021   RDW-CV (Red Cell Distrib* 11/04/2021 13.1   MPV (Mean Platelet Volum* 11/04/2021   Neutrophils 11/04/2021 5.07   Lymphocytes 11/04/2021 2.24   Monocytes 11/04/2021 0.60   Eosinophils 11/04/2021 0.11   Basophils 11/04/2021 0.04   Neutrophil % 11/04/2021 62.5   Lymphocyte % 11/04/2021 27.7    Monocyte % 11/04/2021 7.4   Eosinophil % 11/04/2021 1.4   Basophil% 11/04/2021 0.5   Immature Granulocyte % 11/04/2021 0.5   Immature Granulocyte Cou* 11/04/2021 0.04   ASSESSMENT AND PLAN  Arthur Bauer is a 76 y.o. male seen today for follow up and management of:  1. Hx of adenomatous colonic polyps  2. Barrett's esophagus without dysplasia   Schedule colonoscopy for personal history of colon polyps. The patient is given procedure information including indications for procedure, benefits of procedure as well as possible complications, including but not limited to, bleeding, perforation, drug reaction and infection. The patient agrees with this plan & written consent will be obtained.   Schedule EGD for history of Barrett's esophagus. Remain on omeprazole daily. The patient is given procedure information including indications for procedure, benefits of procedure as well as possible complications, including but not limited to, bleeding, perforation, drug reaction and infection. The patient agrees with this plan & written consent will be obtained. With advancing age, this may be his last upper endoscopy pending short segment Barrett's finding.   Orders Placed This Encounter  Procedures  Ambulatory Referral to Colonoscopy and Upper Endoscopy   Requested Prescriptions   Signed Prescriptions Disp Refills   sodium, potassium, and magnesium (SUPREP) oral solution 354 mL 0  Sig: Take 1 Bottle by mouth as directed One kit contains 2 bottles. Take both bottles at the times instructed by your provider.   DISPOSITION   Planned follow up interval: Pending study results.  I personally performed the service, non-incident to. [facility])   All questions were adequately answered to patient's satisfaction. Patient in agreement with plan of care.  Kimberly A. Jerelene Redden, [facility], Gastroenterology [place], [place] [address] [phone] Electronically signed by Gershon Mussel, NP at 01/11/2022 9:46 PM EDT    No current facility-administered medications for this encounter.  Current Outpatient Medications:    atorvastatin (LIPITOR) 10 MG tablet, TAKE 1 TABLET BY MOUTH EVERY DAY **NEED APPT FOR MORE REFILLS**, Disp: , Rfl: 0   Black Currant Seed Oil 500 MG CAPS, Take by mouth., Disp: , Rfl:    hydrocortisone (ANUSOL-HC) 25 MG suppository, REMOVE FOIL AND INSERT 1 SUPPSITORY PER RECTUM FOR HEMORROIDS 3 TIMES A DAY, Disp: , Rfl: 5   LUTEIN PO, Take by mouth., Disp: , Rfl:    magnesium 30 MG tablet, Take 30 mg by mouth daily., Disp: , Rfl:    Multiple Vitamin (MULTIVITAMIN) capsule, Take 1 capsule by mouth daily., Disp: , Rfl:    NON FORMULARY, argenine and beta sitosterol, Disp: , Rfl:    Omega-3 Fatty Acids (FISH OIL) 1200 MG CAPS, Take by mouth., Disp: , Rfl:    omeprazole (PRILOSEC) 20 MG capsule, TAKE ONE CAPSULE BY MOUTH EVERY DAY AS DIRECTED, Disp: , Rfl: 1  No medications prior to admission.     Allergies  Allergen Reactions   Sulfa Antibiotics Other (See Comments)    As a child     Past Medical History:  Diagnosis Date   Barrett esophagus    Non-Hodgkin lymphoma ([place]) 1985    Review of systems:  Otherwise negative.    Physical Exam  Gen: Alert, oriented. Appears stated age.  HEENT: /AT. PERRLA. Lungs: CTA, no wheezes. CV: RR nl S1, S2. Abd: soft, benign, no masses. BS+ Ext: No edema. Pulses 2+    Planned procedures: Proceed with EGD and colonoscopy. The patient understands the nature of the planned procedure, indications, risks, alternatives and potential complications including but not limited to bleeding, infection, perforation, damage to internal organs and possible oversedation/side effects from anesthesia. The patient agrees and gives consent to proceed.  Please refer to procedure notes for findings, recommendations and patient disposition/instructions.     Abem Shaddix K. Alice Reichert, M.D. Gastroenterology 04/14/2022  12:58  PM

## 2022-04-14 NOTE — Op Note (Signed)
[facility] Gastroenterology Patient Name: Arthur Bauer Procedure Date: 04/14/2022 1:30 PM MRN: [id number] Account #: [id number] Date of Birth: 06/10/1946 Admit Type: Outpatient Age: 76 Room: [facility] ENDO ROOM 2 Gender: Male Note Status: Finalized Instrument Name: Jasper Riling [id number] Procedure:             Colonoscopy Indications:           Surveillance: Personal history of adenomatous polyps                         on last colonoscopy > 5 years ago Providers:             Lorie Apley K. Alice Reichert MD, MD Referring MD:          Youlanda Roys. Lovie Macadamia, MD (Referring MD) Medicines:             Propofol per Anesthesia Complications:         No immediate complications. Procedure:             Pre-Anesthesia Assessment:                        - The risks and benefits of the procedure and the                         sedation options and risks were discussed with the                         patient. All questions were answered and informed                         consent was obtained.                        - Patient identification and proposed procedure were                         verified prior to the procedure by the nurse. The                         procedure was verified in the procedure room.                        - ASA Grade Assessment: III - A patient with severe                         systemic disease.                        - After reviewing the risks and benefits, the patient                         was deemed in satisfactory condition to undergo the                         procedure.                        After obtaining informed consent, the colonoscope was  passed under direct vision. Throughout the procedure,                         the patient's blood pressure, pulse, and oxygen                         saturations were monitored continuously. The                         Colonoscope was introduced through the anus and                          advanced to the the cecum, identified by appendiceal                         orifice and ileocecal valve. The colonoscopy was                         performed without difficulty. The patient tolerated                         the procedure well. The quality of the bowel                         preparation was good. The ileocecal valve, appendiceal                         orifice, and rectum were photographed. Findings:      The perianal and digital rectal examinations were normal. Pertinent       negatives include normal sphincter tone and no palpable rectal lesions.      Internal hemorrhoids were found during retroflexion. The hemorrhoids       were Grade I (internal hemorrhoids that do not prolapse).      Many small-mouthed diverticula were found in the entire colon. There was       no evidence of diverticular bleeding.      A 5 mm polyp was found in the transverse colon. The polyp was sessile.       The polyp was removed with a jumbo cold forceps. Resection and retrieval       were complete.      The exam was otherwise without abnormality. Impression:            - Internal hemorrhoids.                        - Mild diverticulosis in the entire examined colon.                         There was no evidence of diverticular bleeding.                        - One 5 mm polyp in the transverse colon, removed with                         a jumbo cold forceps. Resected and retrieved.                        - The examination was otherwise normal. Recommendation:        -  Await pathology results from EGD, also performed                         today.                        - If Barrett's biopsies are negative, consider                         discontinuing endoscopic surveillance.                        - Patient has a contact number available for                         emergencies. The signs and symptoms of potential                         delayed complications were discussed with the  patient.                         Return to normal activities tomorrow. Written                         discharge instructions were provided to the patient.                        - Resume previous diet.                        - Continue present medications.                        - If polyps are benign or adenomatous without                         dysplasia, I will advise NO further colonoscopy due to                         advanced age and/or severe comorbidity.                        - Return to nurse practitioner as previously scheduled.                        - Follow up with Dawson Bills, NP at [facility]                         Gastroenterology.                        - The findings and recommendations were discussed with                         the patient. Procedure Code(s):     --- Professional ---                        [phone], Colonoscopy, flexible; with biopsy, single or  multiple Diagnosis Code(s):     --- Professional ---                        K57.30, Diverticulosis of large intestine without                         perforation or abscess without bleeding                        K63.5, Polyp of colon                        K64.0, First degree hemorrhoids                        Z86.010, Personal history of colonic polyps CPT copyright 2019 American Medical Association. All rights reserved. The codes documented in this report are preliminary and upon coder review may  be revised to meet current compliance requirements. Efrain Sella MD, MD 04/14/2022 2:13:59 PM This report has been signed electronically. Number of Addenda: 0 Note Initiated On: 04/14/2022 1:30 PM Scope Withdrawal Time: 0 hours 4 minutes 22 seconds  Total Procedure Duration: 0 hours 9 minutes 37 seconds  Estimated Blood Loss:  Estimated blood loss: none.      [facility]

## 2022-04-14 NOTE — Op Note (Signed)
[facility] Gastroenterology Patient Name: Arthur Bauer Procedure Date: 04/14/2022 1:31 PM MRN: [id number] Account #: [id number] Date of Birth: 05-17-1946 Admit Type: Outpatient Age: 76 Room: [facility] ENDO ROOM 2 Gender: Male Note Status: Finalized Instrument Name: Upper Endoscope [id number] Procedure:             Upper GI endoscopy Indications:           Surveillance for malignancy due to personal history of                         Barrett's esophagus Providers:             Benay Pike. Alice Reichert MD, MD Referring MD:          Youlanda Roys. Lovie Macadamia, MD (Referring MD) Medicines:             Propofol per Anesthesia Complications:         No immediate complications. Procedure:             Pre-Anesthesia Assessment:                        - The risks and benefits of the procedure and the                         sedation options and risks were discussed with the                         patient. All questions were answered and informed                         consent was obtained.                        - Patient identification and proposed procedure were                         verified prior to the procedure by the nurse. The                         procedure was verified in the procedure room.                        - ASA Grade Assessment: III - A patient with severe                         systemic disease.                        - After reviewing the risks and benefits, the patient                         was deemed in satisfactory condition to undergo the                         procedure.                        After obtaining informed consent, the endoscope was  passed under direct vision. Throughout the procedure,                         the patient's blood pressure, pulse, and oxygen                         saturations were monitored continuously. The Endoscope                         was introduced through the mouth, and advanced to the                          third part of duodenum. The upper GI endoscopy was                         accomplished without difficulty. The patient tolerated                         the procedure well. Findings:      The esophagus and gastroesophageal junction were examined with white       light. There was no visual evidence of Barrett's esophagus. Mucosa was       biopsied with a cold forceps for histology in 4 quadrants at the       gastroesophageal junction. One specimen bottle was sent to pathology.      Localized mildly erythematous mucosa without bleeding was found in the       gastric antrum.      The examined duodenum was normal.      Multiple medium pedunculated and sessile polyps with no bleeding and no       stigmata of recent bleeding were found in the gastric body. Biopsies       were taken with a cold forceps for histology.      The exam was otherwise without abnormality. Impression:            - There is no endoscopic evidence of Barrett's                         esophagus. Biopsied.                        - Erythematous mucosa in the antrum.                        - The examination was otherwise normal.                        - Normal examined duodenum.                        - The examination was otherwise normal. Recommendation:        - Await pathology results.                        - If Barrett's biopsies are negative, consider                         discontinuing endoscopic surveillance.                        -  Proceed with colonoscopy Procedure Code(s):     --- Professional ---                        [id number], Esophagogastroduodenoscopy, flexible,                         transoral; with biopsy, single or multiple Diagnosis Code(s):     --- Professional ---                        K31.89, Other diseases of stomach and duodenum                        K22.70, Barrett's esophagus without dysplasia CPT copyright 2019 American Medical Association. All rights reserved. The codes documented  in this report are preliminary and upon coder review may  be revised to meet current compliance requirements. Efrain Sella MD, MD 04/14/2022 2:17:38 PM This report has been signed electronically. Number of Addenda: 0 Note Initiated On: 04/14/2022 1:31 PM Estimated Blood Loss:  Estimated blood loss: none. Estimated blood loss: none.      [facility]

## 2022-04-14 NOTE — Interval H&P Note (Signed)
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  The various methods of treatment have been discussed with the patient and family. After consideration of risks, benefits and other options for treatment, the patient has consented to  Procedure(s): COLONOSCOPY (N/A) ESOPHAGOGASTRODUODENOSCOPY (EGD) (N/A) as a surgical intervention.  The patient's history has been reviewed, patient examined, no change in status, stable for surgery.  I have reviewed the patient's chart and labs.  Questions were answered to the patient's satisfaction.     [place], [place]

## 2022-04-14 NOTE — Anesthesia Procedure Notes (Signed)
Procedure Name: MAC Date/Time: 04/14/2022 1:50 PM  Performed by: Tollie Eth, CRNAPre-anesthesia Checklist: Patient identified, Emergency Drugs available, Suction available and Patient being monitored Patient Re-evaluated:Patient Re-evaluated prior to induction Oxygen Delivery Method: Nasal cannula Induction Type: IV induction Placement Confirmation: positive ETCO2

## 2022-04-15 ENCOUNTER — Encounter: Payer: Self-pay | Admitting: Internal Medicine

## 2022-04-15 NOTE — Anesthesia Postprocedure Evaluation (Signed)
Anesthesia Post Note  Patient: Arthur Bauer  Procedure(s) Performed: COLONOSCOPY ESOPHAGOGASTRODUODENOSCOPY (EGD)  Patient location during evaluation: Endoscopy Anesthesia Type: General Level of consciousness: awake and alert Pain management: pain level controlled Vital Signs Assessment: post-procedure vital signs reviewed and stable Respiratory status: spontaneous breathing, nonlabored ventilation, respiratory function stable and patient connected to nasal cannula oxygen Cardiovascular status: blood pressure returned to baseline and stable Postop Assessment: no apparent nausea or vomiting Anesthetic complications: no   No notable events documented.   Last Vitals:  Vitals:   04/14/22 1422 04/14/22 1432  BP: 97/60 (!) 111/52  Pulse: 67   Resp:    Temp:    SpO2: 100%     Last Pain:  Vitals:   04/14/22 1432  TempSrc:   PainSc: 0-No pain                 Precious Haws Isabella Roemmich

## 2022-04-16 LAB — SURGICAL PATHOLOGY

## 2024-05-17 ENCOUNTER — Encounter: Payer: Self-pay | Admitting: Urology

## 2024-05-22 ENCOUNTER — Other Ambulatory Visit: Payer: Self-pay | Admitting: Medical Genetics

## 2024-05-28 ENCOUNTER — Ambulatory Visit: Admitting: Urology

## 2024-05-31 ENCOUNTER — Other Ambulatory Visit
Admission: RE | Admit: 2024-05-31 | Discharge: 2024-05-31 | Disposition: A | Discharge status: Home / Self Care | Payer: Self-pay | Source: Ambulatory Visit | Attending: Medical Genetics | Admitting: Medical Genetics

## 2024-05-31 ENCOUNTER — Other Ambulatory Visit

## 2024-06-08 LAB — GENECONNECT MOLECULAR SCREEN: Genetic Analysis Overall Interpretation: NEGATIVE

## 2024-06-13 DIAGNOSIS — R972 Elevated prostate specific antigen [PSA]: Secondary | ICD-10-CM | POA: Insufficient documentation

## 2024-06-13 NOTE — Progress Notes (Signed)
 06/21/24 1:51 PM   Arthur Bauer February 19, 1946 [id number]   HPI: 78 y.o. 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 Viktoria   COLONOSCOPY N/A 04/14/2022   Procedure: COLONOSCOPY;  Surgeon: Toledo, Ladell POUR, MD;  Location: [facility] ENDOSCOPY;  Service: Gastroenterology;  Laterality: N/A;   ESOPHAGOGASTRODUODENOSCOPY     ESOPHAGOGASTRODUODENOSCOPY N/A 04/14/2022   Procedure: ESOPHAGOGASTRODUODENOSCOPY (EGD);  Surgeon: Toledo, Ladell POUR, MD;  Location: [facility] ENDOSCOPY;  Service: Gastroenterology;  Laterality: N/A;   ESOPHAGOGASTRODUODENOSCOPY (EGD) WITH PROPOFOL  N/A 05/22/2019   Procedure: ESOPHAGOGASTRODUODENOSCOPY (EGD) WITH PROPOFOL ;  Surgeon: Toledo, Ladell POUR, MD;  Location: [facility] ENDOSCOPY;  Service: Gastroenterology;  Laterality: N/A;   HEMORRHOID BANDING  1998   mediastinal surgery for non-Hodgkins lymphoma      Family History: Family History  Problem Relation Age of Onset   Alzheimer's disease Mother    Cancer Father        pancreatic    Social History:  reports that he quit smoking about 40 years ago. His smoking use included cigarettes. He has never used smokeless tobacco. He reports that he does not drink alcohol and does not use drugs.      Physical Exam: BP 132/73 (BP Location: Left Arm,  Patient Position: Sitting, Cuff Size: Normal)   Pulse 64   Ht 5' 8 (1.727 m)   Wt 158 lb 3.2 oz (71.8 kg)   SpO2 92%   BMI 24.05 kg/m    Constitutional:  Alert and oriented, No acute distress. Cardiovascular: No clubbing, cyanosis, or edema. Respiratory: Normal respiratory effort, no increased work of breathing. GI: Nondistended GU: 60-70 g prostate, symmetric, no discrete firm nodules Skin: No rashes, bruises or suspicious lesions. Neurologic: Grossly intact, no focal deficits, moving all 4 extremities. Psychiatric: Normal mood and affect.  Laboratory Data: PSA 6.0 (04/23/24) PSA 5.79 (2023)  PSA 3.56 (2021) PSA 4.12 (2020)   Pertinent Imaging: N/A    Assessment & Plan:    Elevated PSA Assessment & Plan: PSA baseline 3-5.8 (since 2020) PSA 6.0 (04/23/24)  We discussed the significance of an elevated prostate-specific antigen (PSA) level. PSA is a nonspecific marker and may be elevated due to both benign and malignant causes. Benign factors include benign prostatic hyperplasia (BPH), prostatitis or urinary tract infection, recent ejaculation, catheterization or instrumentation, and advancing age. Malignant causes include prostate cancer of varying risk categories.  We reviewed that a single PSA value is less informative than following PSA trends over time, which can better reflect underlying pathology. Risk factors for prostate cancer include increasing age, family history of prostate cancer, African American race, and known germline mutations (e.g., BRCA2).  Next steps may include repeating PSA to confirm elevation, consideration of additional biomarkers or PSA derivatives (e.g., %  free PSA, PSA density), and obtaining a multiparametric prostate MRI to assess for suspicious lesions. Based on PSA kinetics, risk profile, and MRI findings, a prostate biopsy may be recommended for definitive diagnosis.  - PSA slightly uptrending, but largely stable over the last couple of years.   Current value is within normal range for 87-45 year old.  I think the risk of underlying clinically significant cancer is low.  DRE reassuring today - Recommend PSA in 1 year-discussed phasing out routine PSA screening if continued stability   Orders: -     PSA; Future      Penne Skye, MD 06/21/2024  [facility] [address] [place], [place] [address] [phone]

## 2024-06-13 NOTE — Assessment & Plan Note (Addendum)
 PSA baseline 3-5.8 (since 2020) PSA 6.0 (04/23/24)  We discussed the significance of an elevated prostate-specific antigen (PSA) level. PSA is a nonspecific marker and may be elevated due to both benign and malignant causes. Benign factors include benign prostatic hyperplasia (BPH), prostatitis or urinary tract infection, recent ejaculation, catheterization or instrumentation, and advancing age. Malignant causes include prostate cancer of varying risk categories.  We reviewed that a single PSA value is less informative than following PSA trends over time, which can better reflect underlying pathology. Risk factors for prostate cancer include increasing age, family history of prostate cancer, African American race, and known germline mutations (e.g., BRCA2).  Next steps may include repeating PSA to confirm elevation, consideration of additional biomarkers or PSA derivatives (e.g., %free PSA, PSA density), and obtaining a multiparametric prostate MRI to assess for suspicious lesions. Based on PSA kinetics, risk profile, and MRI findings, a prostate biopsy may be recommended for definitive diagnosis.  - PSA slightly uptrending, but largely stable over the last couple of years.  Current value is within normal range for 49-13 year old.  I think the risk of underlying clinically significant cancer is low.  DRE reassuring today - Recommend PSA in 1 year-discussed phasing out routine PSA screening if continued stability

## 2024-06-18 NOTE — Progress Notes (Signed)
 Cardiology Follow Up Patient Visit  PRIMARY CARE PROVIDER:   Glover Alm Hail, MD [phone] [address] [place] [address]  [name], [name] 06/18/2024 DOB: 1946-06-04 Age: 78 y.o. [name]    Chief Complaint  Patient presents with  . h/o CHB    History of Present Illness   Arthur Bauer is a 78 y.o. male here for f/u of high grade AVB. Previously seen by Dr Hester. He underwent dual chamber PPM implant on 07/27/23 w/ Dr Debby. I last saw him about six mos ago. Today states he feels well. Continues to enjoy traveling - recently went to [place], [place]. Is planning a trip to [place].  No SOB or DOE. No PND or orthopnea. No syncope. Saw EP in f/u on 05/24/24. 100% RVP noted at that time. Rate response sensitivity increased. RV thresholds noted to be increased at that time. Pt will f/u with Dr Debby in the spring.   Current Medications and Allergies   Allergies  Allergen Reactions  . Sulfa (Sulfonamide Antibiotics) Unknown    Current Outpatient Medications  Medication Sig Dispense Refill  . atorvastatin (LIPITOR) 10 MG tablet TAKE 1 TABLET BY MOUTH EVERY DAY 60 tablet 0  . cholecalciferol (VITAMIN D3) 1,000 unit capsule Take 1,000 Units by mouth once daily    . Herbal Supplement Take 1 tablet by mouth once daily Herbal Name: L-citroline    . L-GLUTAMINE ORAL Take 1 tablet by mouth once daily    . L.acid/B.bifidum/B.animal/FOS (PROBIOTIC COMPLEX ORAL) Take 1 tablet by mouth once daily    . MAGNESIUM CITRATE ORAL Take 1 tablet by mouth once daily    . multivitamin tablet Take 1 tablet by mouth once daily.    SABRA omeprazole (PRILOSEC) 20 MG DR capsule TAKE 1 CAPSULE BY MOUTH EVERY DAY 90 capsule 0  . zinc sulfate (ZINC-15 ORAL) Take 1 tablet by mouth once daily     No current facility-administered medications for this visit.    Additional Past Medical, Social, and Family History:  ADDITIONAL PROBLEM LIST: Problem List  Date Reviewed: 06/18/2024        ICD-10-CM Priority Class  Noted - Resolved Diagnosed   Dual Chamber Pacemaker: [employer] / LBAP Z95.0   07/28/2023 - Present    Overview Addendum 10/18/2023  7:38 PM by Butler Dorn Kotyk, NP  Manufacturer: [employer] Name: CONSUELLA BEDFORD DR EL MRI Model Number: [id number] S/N: [id number] Placement: Left infraclavicular subcutaneous Date if Implant: 07/27/23  Manufacturer: [employer] Name: ARTURO GHENT PLUS 52CM S/N: [id number] Lead type: New Lead Location: RA lateral wall Date of Implant: 07/27/2023  Manufacturer: [employer] Name: ARTURO GHENT PLUS 59CM S/N: [id number] Lead type: New Lead Location: Left Bundle Date of Implant: 07/27/2023      B-cell lymphoma (CMS/HHS-HCC) C85.10   07/19/2023 - Present    Gastroesophageal reflux disease without esophagitis K21.9   07/19/2023 - Present    Complete heart block (CMS/HHS-HCC) I44.2   07/19/2023 - Present    Preoperative evaluation to rule out surgical contraindication Z01.818   07/19/2023 - Present    Hx of adenomatous colonic polyps Z86.0101   01/05/2022 - Present    Elevated PSA R97.20   07/14/2020 - Present    Hypercholesterolemia E78.00   10/21/2014 - Present    Barrett's esophagus with dysplasia K22.719   10/21/2014 - Present    RESOLVED: Symptomatic bradycardia R00.1   07/19/2023 - 10/18/2023    RESOLVED: Second degree AV block, Mobitz type I I44.1   10/23/2017 -  10/18/2023    RESOLVED: Complete heart block (CMS/HHS-HCC) I44.2   10/20/2017 - 07/14/2020     ADDITIONAL MEDICAL HISTORY: Past Medical History:  Diagnosis Date  . Barrett's esophagus   . COVID-19 2020  . ED (erectile dysfunction)   . Gastritis   . History of B-cell lymphoma   . History of hemorrhoids     Social History: See HPI above. Additionally: Social History   Socioeconomic History  . Marital status: Married  Tobacco Use  . Smoking status: Former  . Smokeless tobacco: Never  Vaping Use  . Vaping status: Never Used   Substance and Sexual Activity  . Alcohol use: No  . Drug use: Never  . Sexual activity: Defer  Social History Narrative   Shingrix:2018   Social Drivers of Health   Financial Resource Strain: Low Risk  (03/25/2024)   Overall Financial Resource Strain (CARDIA)   . Difficulty of Paying Living Expenses: Not hard at all  Food Insecurity: No Food Insecurity (03/25/2024)   Hunger Vital Sign   . Worried About Programme researcher, broadcasting/film/video in the Last Year: Never true   . Ran Out of Food in the Last Year: Never true  Transportation Needs: No Transportation Needs (03/25/2024)   PRAPARE - Transportation   . Lack of Transportation (Medical): No   . Lack of Transportation (Non-Medical): No  Housing Stability: Low Risk  (03/25/2024)   Housing Stability Vital Sign   . Unable to Pay for Housing in the Last Year: No   . Number of Times Moved in the Last Year: 0   . Homeless in the Last Year: No    Family History: See HPI above. Additionally: Family History  Problem Relation Name Age of Onset  . High blood pressure (Hypertension) Mother    . Stroke Mother    . High blood pressure (Hypertension) Father    . Colon cancer Father    . Pancreatic cancer Father    . Coronary Artery Disease (Blocked arteries around heart) Paternal Grandmother    . Coronary Artery Disease (Blocked arteries around heart) Paternal Grandfather      Review of Systems: See HPI above. Additionally: Constitutional: no fevers or chills Eyes: no diplopia ENT: no epistaxis Cardiovascular: See HPI above Respiratory: See HPI above Gastrointestinal: no BRBPR Genitourinary: no hematuria Musculoskeletal: no joint swelling Skin: no rash Neurological: no strokelike sx Psychiatric: no SI Endocrine: no night sweats Hematologic/Lymphatic: no bleeding Allergic/Immunologic: See allergies above   Physical Exam   Vitals:   06/18/24 1145  BP: 108/70  Pulse: 58   Body mass index is 23.57 kg/m.  Wt Readings from Last 3 Encounters:   06/18/24 70.3 kg (155 lb)  05/24/24 69.9 kg (154 lb 3.2 oz)  03/28/24 71.8 kg (158 lb 3.2 oz)   BP Readings from Last 3 Encounters:  06/18/24 108/70  05/24/24 127/76  03/28/24 134/78   Pulse Readings from Last 3 Encounters:  06/18/24 58  05/24/24 63  03/28/24 66     Gen: NAD Neck: No JVD Chest wall: L upper pectoral device pocket incision c/d/I, well healed CV: nl s1s2, nl rate, regular, no significant murmur Resp: CTAB, nl effort GI: abd soft, NT Skin: warm Edema: no LE edema MSK: no obvious deformity Psych: nl affect Neuro: awake and alert Endo: no thyromegaly Lymph: no cervical LAD   Data/Results   Recent Labs    11/04/21 1228 03/01/23 1144 03/28/24 1610  CHOLTOTAL 149 126 145  HDL 41.9 32.1 36.0  LDLCALC 80  65 64  VLDL 27 29 45  TRIG 136 144 225*    Recent Labs    11/04/21 1228 03/01/23 1144 07/27/23 0833 03/28/24 1610  NA 140 139 138 141  K 4.4 4.5 4.2 4.7  BUN 21 22 22* 23  CREATININE 1.0 0.8 1.0 0.9  CO2 25.1 28.8 24 27.4  GLUCOSE 84 89 99 92  ALT 26 33  --  45  AST 23 23  --  26  TBILI 1.1 0.9  --  0.7  ALB 4.5 4.4  --  4.5    Recent Labs    03/01/23 1144 07/27/23 0833 03/28/24 1610  WBC 7.4 7.3 7.6  HGB 15.3 14.9 15.4  HCT 46.2 45.2 47.6  MCV 98.9 97 98.6  PLT 170 155 168    Recent Labs    07/27/23 0833  INR 1.0     ECGs: I personally interpreted tracing from 05/24/24 which shows A-V paced rhythm  Echo 12/10/21 LOW-NORMAL LEFT VENTRICULAR SYSTOLIC FUNCTION WITH AN ESTIMATED EF=45 % NORMAL RIGHT VENTRICULAR SYSTOLIC FUNCTION MILD VALVULAR REGURGITATION (See above) - MR, TR NO VALVULAR STENOSIS  Echo 01/2023 NORMAL LEFT VENTRICULAR SYSTOLIC FUNCTION NORMAL RIGHT VENTRICULAR SYSTOLIC FUNCTION MILD VALVULAR REGURGITATION (See above) NO VALVULAR STENOSIS Mild LV dilatation with low-normal LVEF. Abnormal septal motion c/w LBBB. Mild AR, TR.  Assessment    78 y.o. M with   -CHB s/p dual chamber PPM 07/27/23  -abnormal  ECG -RBBB -PVC -HLD -mild MR -mild TR  Plan   -obtain echo -no changes to rx today -f/u 6 mos   Attestation Statement:   I personally performed the service. (TP)  DEARL LEAVEN, MD   DEARL LEAVEN MD [facility] Assistant Professor of Medicine, Division of Cardiology [facility]

## 2024-06-21 ENCOUNTER — Encounter: Payer: Self-pay | Admitting: Urology

## 2024-06-21 ENCOUNTER — Ambulatory Visit (INDEPENDENT_AMBULATORY_CARE_PROVIDER_SITE_OTHER): Admitting: Urology

## 2024-06-21 VITALS — BP 132/73 | HR 64 | Ht 68.0 in | Wt 158.2 lb

## 2024-06-21 DIAGNOSIS — R972 Elevated prostate specific antigen [PSA]: Secondary | ICD-10-CM

## 2024-06-21 NOTE — Addendum Note (Signed)
 Addended byBETHA CORIE PLATER on: 06/21/2024 03:25 PM   Modules accepted: Orders

## 2024-06-22 LAB — PSA: Prostate Specific Ag, Serum: 5.3 ng/mL — ABNORMAL HIGH (ref 0.0–4.0)

## 2025-06-20 ENCOUNTER — Ambulatory Visit: Admitting: Urology
